# Patient Record
Sex: Male | Born: 1995 | Race: Black or African American | Hispanic: No | Smoking: Never smoker
Health system: Southern US, Community
[De-identification: ages and names within clinical notes are randomized; demographics above are authoritative.]

---
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
## 2015-06-19 ENCOUNTER — Emergency Department (HOSPITAL_COMMUNITY)
Admission: EM | Admit: 2015-06-19 | Discharge: 2015-06-20 | Disposition: A | Discharge status: Home / Self Care | Payer: BLUE CROSS/BLUE SHIELD | Attending: Emergency Medicine | Admitting: Emergency Medicine

## 2015-06-19 ENCOUNTER — Encounter (HOSPITAL_COMMUNITY): Payer: Self-pay

## 2015-06-19 ENCOUNTER — Emergency Department (HOSPITAL_COMMUNITY): Payer: BLUE CROSS/BLUE SHIELD

## 2015-06-19 DIAGNOSIS — S92512B Displaced fracture of proximal phalanx of left lesser toe(s), initial encounter for open fracture: Secondary | ICD-10-CM | POA: Insufficient documentation

## 2015-06-19 DIAGNOSIS — Z23 Encounter for immunization: Secondary | ICD-10-CM | POA: Diagnosis not present

## 2015-06-19 DIAGNOSIS — Y92410 Unspecified street and highway as the place of occurrence of the external cause: Secondary | ICD-10-CM | POA: Diagnosis not present

## 2015-06-19 DIAGNOSIS — Y9301 Activity, walking, marching and hiking: Secondary | ICD-10-CM | POA: Insufficient documentation

## 2015-06-19 DIAGNOSIS — S92912B Unspecified fracture of left toe(s), initial encounter for open fracture: Secondary | ICD-10-CM

## 2015-06-19 DIAGNOSIS — Y998 Other external cause status: Secondary | ICD-10-CM | POA: Insufficient documentation

## 2015-06-19 DIAGNOSIS — S92522B Displaced fracture of medial phalanx of left lesser toe(s), initial encounter for open fracture: Secondary | ICD-10-CM | POA: Insufficient documentation

## 2015-06-19 DIAGNOSIS — S99922A Unspecified injury of left foot, initial encounter: Secondary | ICD-10-CM | POA: Diagnosis present

## 2015-06-19 DIAGNOSIS — W3400XA Accidental discharge from unspecified firearms or gun, initial encounter: Secondary | ICD-10-CM | POA: Diagnosis not present

## 2015-06-19 MED ORDER — AMPICILLIN-SULBACTAM SODIUM 1.5 (1-0.5) G IJ SOLR
1.5000 g | Freq: Once | INTRAMUSCULAR | Status: AC
  Administered 2015-06-20: 1.5 g via INTRAVENOUS
  Filled 2015-06-19: qty 1.5

## 2015-06-19 MED ORDER — TETANUS-DIPHTH-ACELL PERTUSSIS 5-2.5-18.5 LF-MCG/0.5 IM SUSP
0.5000 mL | Freq: Once | INTRAMUSCULAR | Status: AC
  Administered 2015-06-20: 0.5 mL via INTRAMUSCULAR
  Filled 2015-06-19: qty 0.5

## 2015-06-19 MED ORDER — BUPIVACAINE HCL 0.5 % IJ SOLN
50.0000 mL | Freq: Once | INTRAMUSCULAR | Status: AC
  Administered 2015-06-20: 50 mL
  Filled 2015-06-19: qty 60

## 2015-06-19 MED ORDER — ONDANSETRON HCL 4 MG/2ML IJ SOLN
4.0000 mg | Freq: Once | INTRAMUSCULAR | Status: AC
  Administered 2015-06-19: 4 mg via INTRAVENOUS
  Filled 2015-06-19: qty 2

## 2015-06-19 MED ORDER — MORPHINE SULFATE (PF) 4 MG/ML IV SOLN
4.0000 mg | Freq: Once | INTRAVENOUS | Status: AC
Start: 2015-06-19 — End: 2015-06-19
  Administered 2015-06-19: 4 mg via INTRAVENOUS
  Filled 2015-06-19: qty 1

## 2015-06-19 NOTE — ED Notes (Signed)
Dr. Pollina back at the bedside.  
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
## 2015-06-20 MED ORDER — HYDROCODONE-ACETAMINOPHEN 5-325 MG PO TABS: 1.0000 | ORAL_TABLET | ORAL | Status: AC | PRN

## 2015-06-20 MED ORDER — DOXYCYCLINE HYCLATE 100 MG PO CAPS: 100.0000 mg | ORAL_CAPSULE | Freq: Two times a day (BID) | ORAL | Status: AC
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
